# Patient Record
Sex: Female | Born: 1985
Health system: Southern US, Community
[De-identification: ages and names within clinical notes are randomized; demographics above are authoritative.]

---

## 2018-11-03 ENCOUNTER — Other Ambulatory Visit: Payer: Self-pay | Admitting: Family Medicine

## 2018-11-03 DIAGNOSIS — N644 Mastodynia: Secondary | ICD-10-CM

## 2018-11-03 DIAGNOSIS — N6320 Unspecified lump in the left breast, unspecified quadrant: Secondary | ICD-10-CM

## 2018-11-09 ENCOUNTER — Ambulatory Visit
Admission: RE | Admit: 2018-11-09 | Discharge: 2018-11-09 | Disposition: A | Discharge status: Home / Self Care | Payer: 59 | Source: Ambulatory Visit | Attending: Family Medicine | Admitting: Family Medicine

## 2018-11-09 ENCOUNTER — Other Ambulatory Visit: Payer: Self-pay

## 2018-11-09 ENCOUNTER — Other Ambulatory Visit: Payer: Self-pay | Admitting: Family Medicine

## 2018-11-09 ENCOUNTER — Ambulatory Visit: Payer: Self-pay

## 2018-11-09 DIAGNOSIS — N6320 Unspecified lump in the left breast, unspecified quadrant: Secondary | ICD-10-CM

## 2018-11-09 DIAGNOSIS — N644 Mastodynia: Secondary | ICD-10-CM

## 2019-05-15 ENCOUNTER — Other Ambulatory Visit: Payer: 59

## 2019-06-02 ENCOUNTER — Other Ambulatory Visit: Payer: 59

## 2019-06-14 ENCOUNTER — Other Ambulatory Visit (HOSPITAL_COMMUNITY)
Admission: RE | Admit: 2019-06-14 | Discharge: 2019-06-14 | Disposition: A | Discharge status: Home / Self Care | Payer: 59 | Source: Ambulatory Visit | Attending: Family Medicine | Admitting: Family Medicine

## 2019-06-14 ENCOUNTER — Other Ambulatory Visit: Payer: Self-pay | Admitting: Family Medicine

## 2019-06-14 DIAGNOSIS — Z124 Encounter for screening for malignant neoplasm of cervix: Secondary | ICD-10-CM | POA: Diagnosis present

## 2019-06-16 ENCOUNTER — Other Ambulatory Visit: Payer: Self-pay | Admitting: Family Medicine

## 2019-06-16 ENCOUNTER — Ambulatory Visit
Admission: RE | Admit: 2019-06-16 | Discharge: 2019-06-16 | Disposition: A | Discharge status: Home / Self Care | Payer: 59 | Source: Ambulatory Visit | Attending: Family Medicine | Admitting: Family Medicine

## 2019-06-16 ENCOUNTER — Other Ambulatory Visit: Payer: Self-pay

## 2019-06-16 DIAGNOSIS — N6320 Unspecified lump in the left breast, unspecified quadrant: Secondary | ICD-10-CM

## 2019-06-16 LAB — CYTOLOGY - PAP
Comment: NEGATIVE
Diagnosis: NEGATIVE
High risk HPV: NEGATIVE

## 2019-12-11 ENCOUNTER — Other Ambulatory Visit: Payer: 59

## 2019-12-26 ENCOUNTER — Ambulatory Visit
Admission: RE | Admit: 2019-12-26 | Discharge: 2019-12-26 | Disposition: A | Discharge status: Home / Self Care | Payer: 59 | Source: Ambulatory Visit | Attending: Family Medicine | Admitting: Family Medicine

## 2019-12-26 ENCOUNTER — Other Ambulatory Visit: Payer: Self-pay

## 2019-12-26 ENCOUNTER — Other Ambulatory Visit: Payer: Self-pay | Admitting: Family Medicine

## 2019-12-26 DIAGNOSIS — N6320 Unspecified lump in the left breast, unspecified quadrant: Secondary | ICD-10-CM

## 2020-06-18 DIAGNOSIS — Z0000 Encounter for general adult medical examination without abnormal findings: Secondary | ICD-10-CM | POA: Diagnosis not present

## 2020-06-18 DIAGNOSIS — Z13220 Encounter for screening for lipoid disorders: Secondary | ICD-10-CM | POA: Diagnosis not present

## 2020-06-18 DIAGNOSIS — E039 Hypothyroidism, unspecified: Secondary | ICD-10-CM | POA: Diagnosis not present

## 2020-07-24 ENCOUNTER — Encounter: Payer: Self-pay | Admitting: Registered"

## 2020-07-24 ENCOUNTER — Encounter: Payer: BC Managed Care – PPO | Attending: Family Medicine | Admitting: Registered"

## 2020-07-24 ENCOUNTER — Other Ambulatory Visit: Payer: Self-pay

## 2020-07-24 DIAGNOSIS — Z713 Dietary counseling and surveillance: Secondary | ICD-10-CM | POA: Insufficient documentation

## 2020-07-24 NOTE — Progress Notes (Signed)
Appointment start time: 11:25  Appointment end time: 12:33  Patient was seen on 07/24/2020 for nutrition counseling pertaining to disordered eating  Primary care provider: Milagros Evener, MD Therapist: N/A  ROI: N/A Any other medical team members: none   Assessment  Pt arrives stating she was 188 lbs in 07/2019 and lost 26 lbs in 9 months. States she was weighing 162 lbs in 04/2020. States since then she has been gradually increasing in weight, recently weight about 176 lbs. States she is able to lose weight again by restricting.   States she is a vegetarian.   States she was diagnosed with COVID since 04/2020, was in [place], and gained some weight back. States she has been unable to exercise and able to to eat according to diet to achieve weight loss. States her weight increases during the day when she checks at night because she ate during the day. States weight is usually 1-2 lbs more. States it takes her a whole week of dieting to return to basal weight if she eats out that week. States she has been aiming for 1000 cals/day and was told this is not enough for her; has increased to 1200 cals/day. States she eats when hungry. Will eat dates or milk when she wakes up hungry during the night. States she eats rice and gains weight. States sources of protein for her are lentils, protein powder mixed with water. States she mixes protein powder only with water because its easy. States she does not eat fruit before lunch and did not like fruits and vegetables much as child.   Reports hunger triggers migraines; takes naproxen to help manage.   States she has lower back issues. Reports her concerns of continuing to gain weight and fears it will affect knees later in life.   Works as Government social research officer. Pt is married with one son (60 years old).    Growth Metrics: Goal rate of weight gain:  0.5-1.0 lb/week  Eating history: Length of time:  Previous treatments: none Goals for RD meetings: improve  dizziness/lightheadedness, headaches, low BP, focus and concentration, cold intolerance  Weight history:  Highest weight:    Lowest weight:  Most consistent weight:   What would you like to weigh: How has weight changed in the past year: weight loss and gain  Medical Information:  Changes in hair, skin, nails since ED started: hair shed Chewing/swallowing difficulties: no Reflux or heartburn: no Trouble with teeth: no LMP without the use of hormones: 3/10  Weight at that point: 176 Effect of exercise on menses: N/A   Effect of hormones on menses: N/A Constipation, diarrhea: no, has BM daily Dizziness/lightheadedness: sometimes Headaches/body aches: every weekend due to sleeping later and waking up hungrier Heart racing/chest pain: has low BP Mood: tired, fatigue Sleep: some challenges; averages 7 hrs Focus/concentration: yes, sometimes Cold intolerance: yes Vision changes: no  Mental health diagnosis:    Dietary assessment: A typical day consists of 1-2 meals and 2 snacks  Safe foods include:  Avoided foods include: gluten (sometimes), meat  24 hour recall:  B: coffee (with unsweetened coconut milk + full fat milk) + mac and cheese or protein drink with water S: L: ~1 lb watermelon  S: coffee (with milk) + 1/2 c rice + 2 eggs + 1 c chickpea snacks  D (6:30 pm): quinoa + lentil curry + 2 almond tortillas  S:   Beverages: coffee, herbal tea, water (3L; 64*3 oz)  What Methods Do You Use To Control Your  Weight (Compensatory behaviors)?           Restricting (calories, fat, carbs)  Estimated energy intake: 1400-1500 kcal  Estimated energy needs: 1800-2000 kcal 200-225 g CHO 135-150 g pro 50-56 g fat  Nutrition Diagnosis: NB-1.5 Disordered eating pattern As related to skipping meals.  As evidenced by dietary recall.  Intervention/Goals: Pt was educated and counseled on eating to nourish the body, signs/symptoms of not being adequately nourished, and ways to increase  nourishment. Discussed side effects of hypothyroidism including weight gain. Discussed diet culture and BMI related to overall health. Pt agreed with goals listed. Goals: - Aim to have 3 meals/day. Meals should include 1/2 plate starch/grain + 1/4 plate protein + 1/4 plate fruit/vegetable + calcium/dairy + fat.  - Add oatmeal + banana to protein drink for breakfast.    Meal plan:    3 meals    1-2 snacks   Monitoring and Evaluation: Patient will follow up in 4 weeks.

## 2020-07-24 NOTE — Patient Instructions (Signed)
-   Aim to have 3 meals/day. Meals should include 1/2 plate starch/grain + 1/4 plate protein + 1/4 plate fruit/vegetable + calcium/dairy + fat.   - Add oatmeal + banana to protein drink for breakfast.

## 2020-08-13 ENCOUNTER — Encounter: Payer: Self-pay | Admitting: Registered"

## 2020-08-13 ENCOUNTER — Other Ambulatory Visit: Payer: Self-pay

## 2020-08-13 ENCOUNTER — Encounter: Payer: BC Managed Care – PPO | Attending: Family Medicine | Admitting: Registered"

## 2020-08-13 DIAGNOSIS — Z713 Dietary counseling and surveillance: Secondary | ICD-10-CM | POA: Diagnosis not present

## 2020-08-13 NOTE — Patient Instructions (Addendum)
-   Aim to have afternoon snack of carbohydrate + protein.   - Try sleepytime tea at night to help wind down.   - Continue to having balanced meals! Great job with this!

## 2020-08-13 NOTE — Progress Notes (Signed)
Appointment start time: 2:07  Appointment end time: 3:00  Patient was seen on 08/13/2020 for nutrition counseling pertaining to disordered eating  Primary care provider: Beverley Fiedler, MD Therapist: N/A  ROI: N/A Any other medical team members: none   Assessment  Pt arrives stating she has increased nourishment since previous visit. States signs and symptoms have improved. States migraines have decreased, only 1 migraine during the night. States she has only taken migraine medicine once since previous visit 2.5 weeks ago. States she used to take once a week.   Previous visit: Pt arrives stating she was 188 lbs in 07/2019 and lost 26 lbs in 9 months. States she was weighing 162 lbs in 04/2020. States since then she has been gradually increasing in weight, recently weight about 176 lbs. States she is a vegetarian. States sources of protein for her are lentils, protein powder mixed with water. States she has lower back issues. Reports her concerns of continuing to gain weight and fears it will affect knees later in life. 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    Growth Metrics: Goal rate of weight gain:  0.5-1.0 lb/week  Eating history: Length of time:  Previous treatments: none Goals for RD meetings: improve dizziness/lightheadedness, headaches, low BP, focus and concentration, cold intolerance  Weight history:  Highest weight:    Lowest weight:  Most consistent weight:   What would you like to weigh: How has weight changed in the past year: weight loss and gain  Medical Information:  Changes in hair, skin, nails since ED started: hair shed Chewing/swallowing difficulties: no Reflux or heartburn: no Trouble with teeth: no LMP without the use of hormones: 3/10  Weight at that point: 176 Effect of exercise on menses: N/A   Effect of hormones on menses: N/A Constipation, diarrhea: no, has BM daily Dizziness/lightheadedness: has improved Headaches/body  aches: every weekend due to sleeping later and waking up hungrier Heart racing/chest pain: has low BP Mood: has improved, no longer feeling tired/fatigued Sleep: some challenges; averages 7 hrs Focus/concentration: yes, sometimes Cold intolerance: yes Vision changes: no  Mental health diagnosis:    Dietary assessment: A typical day consists of 3 meals and 1 snacks  Safe foods include:  Avoided foods include: gluten (sometimes), meat  24 hour recall:  B (8 am): oatmeal + protein powder + banana S (9:30 am): L: rice + vegetables + gravy + flatbread/roti + vegetable curry S: coffee (with milk) + popcorn or fruit  D (6:30 pm): rice + vegetables + gravy + flatbread + vegetable curry + fruit   S:   Beverages: coffee, herbal tea, water (3L; 64*3 oz)  Physical activity: 30 min walking, 4x/week  What Methods Do You Use To Control Your Weight (Compensatory behaviors)?           Restricting (calories, fat, carbs)  Estimated energy intake: 1600-1700 kcal  Estimated energy needs: 1800-2000 kcal 200-225 g CHO 135-150 g pro 50-56 g fat  Nutrition Diagnosis: NB-1.5 Disordered eating pattern As related to skipping meals.  As evidenced by dietary recall.  Intervention/Goals: Pt was encouraged with changes made from previous visit. Discussed improvement in signs/symptoms with increased nourishment. Dicussed movement to help with mobility and preventing knee challenges later in life. Pt agreed with goals listed. Goals: - Aim to have afternoon snack of carbohydrate + protein.  - Try sleepytime tea at night to help wind down.  - Continue to having balanced meals! Great job with this!  Meal plan:    3 meals    1-2 snacks   Monitoring and Evaluation: Patient will follow up in 3 weeks.

## 2020-08-14 ENCOUNTER — Ambulatory Visit: Payer: BC Managed Care – PPO | Admitting: Registered"

## 2020-09-04 ENCOUNTER — Encounter: Payer: BC Managed Care – PPO | Admitting: Registered"

## 2020-09-04 ENCOUNTER — Other Ambulatory Visit: Payer: Self-pay

## 2020-09-04 ENCOUNTER — Encounter: Payer: Self-pay | Admitting: Registered"

## 2020-09-04 DIAGNOSIS — Z713 Dietary counseling and surveillance: Secondary | ICD-10-CM

## 2020-09-04 NOTE — Patient Instructions (Signed)
-   Have evening snack of fruit + protein such as trail mix or fruit + protein shake (using 1/2 scoop of protein powder) or fruit + nuts.   - Keep up the great work!

## 2020-09-04 NOTE — Progress Notes (Signed)
Appointment start time: 4:01  Appointment end time: 4:56  Patient was seen on 09/04/2020 for nutrition counseling pertaining to disordered eating  Primary care provider: Milagros Evener, MD Therapist: N/A  ROI: N/A Any other medical team members: none   Assessment  Pt states she was on vacation last week at the beach with family. 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 Reports she has been more tired lately and having challenges with pollen. Reports drinking more coffee due to tiredness. States she has gained weight gradually over time; gained about 3 lbs in the last month.   States when waking up at night, will drink milk. States she has a migraine 2 times since previous visit. States she had 2 slices of bread at night once.   States she is learning how to swim.   Previous visit:  States signs and symptoms have improved. States migraines have decreased. Pt arrives stating she was 188 lbs in 07/2019 and lost 26 lbs in 9 months. States she was weighing 162 lbs in 04/2020. States since then she has been gradually increasing in weight, recently weight about 176 lbs. States she is a vegetarian. States sources of protein for her are lentils, protein powder mixed with water. States she has lower back issues. Reports her concerns of continuing to gain weight and fears it will affect knees later in life. 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    Growth Metrics: Goal rate of weight gain:  0.5-1.0 lb/week  Eating history: Length of time:  Previous treatments: none Goals for RD meetings: improve dizziness/lightheadedness, headaches, low BP, focus and concentration, cold intolerance  Weight history:  Highest weight:    Lowest weight:  Most consistent weight:   What would you like to weigh: How has weight changed in the past year: weight loss and gain  Medical Information:  Changes in hair, skin, nails since ED started: hair  shed Chewing/swallowing difficulties: no Reflux or heartburn: no Trouble with teeth: no LMP without the use of hormones: 4/27  Weight at that point: 176 Effect of exercise on menses: N/A   Effect of hormones on menses: N/A Constipation, diarrhea: no, has BM daily Dizziness/lightheadedness: no Headaches/body aches: 2x since previous visit  Heart racing/chest pain: has low BP Mood: has improved, no longer feeling tired/fatigued Sleep: sleep has improved, some challenges; averages 7 hrs Focus/concentration: has improved Cold intolerance: has improved Vision changes: no  Mental health diagnosis:    Dietary assessment: A typical day consists of 3 meals and 1 snacks  Safe foods include:  Avoided foods include: gluten (sometimes), meat, salad  24 hour recall:  B (8 am): oatmeal + protein powder + banana or mac and cheese + banana S (9:30 am): whole milk or 3 coffee  L: dal/lentil + roti + gravy or rice + vegetables + gravy + flatbread/roti + vegetable curry S (4 pm): protein powder + coffee (with milk) + wafers  D (6:30 pm): rice + vegetables + chips + yogurt or vegetable pizza  S: ice cream or almond crackers  Beverages: coffee, herbal tea, water (3L; 64*3 oz), protein shakes  Physical activity: 30 min walking, 4x/week, swim lessons once/week  What Methods Do You Use To Control Your Weight (Compensatory behaviors)?           Restricting (calories, fat, carbs)  Estimated energy intake: 1600-1700 kcal  Estimated energy needs: 1800-2000 kcal 200-225 g CHO 135-150 g  pro 50-56 g fat  Nutrition Diagnosis: NB-1.5 Disordered eating pattern As related to skipping meals.  As evidenced by dietary recall.  Intervention/Goals: Pt was encouraged with changes made from previous visit. Discussed improvement in signs/symptoms with increased nourishment. Discussed balanced snack options for evening snack. Pt agreed with goals listed. Goals: - Have evening snack of fruit + protein such as  trail mix or fruit + protein shake (using 1/2 scoop of protein powder) or fruit + nuts.  - Keep up the great work!   Meal plan:    3 meals    1-2 snacks   Monitoring and Evaluation: Patient will follow up in 6 weeks.

## 2020-10-11 DIAGNOSIS — W260XXA Contact with knife, initial encounter: Secondary | ICD-10-CM | POA: Diagnosis not present

## 2020-10-11 DIAGNOSIS — S61012A Laceration without foreign body of left thumb without damage to nail, initial encounter: Secondary | ICD-10-CM | POA: Diagnosis not present

## 2020-10-15 ENCOUNTER — Encounter: Payer: BC Managed Care – PPO | Attending: Family Medicine | Admitting: Registered"

## 2020-10-15 ENCOUNTER — Encounter: Payer: Self-pay | Admitting: Registered"

## 2020-10-15 ENCOUNTER — Other Ambulatory Visit: Payer: Self-pay

## 2020-10-15 DIAGNOSIS — Z713 Dietary counseling and surveillance: Secondary | ICD-10-CM | POA: Diagnosis not present

## 2020-10-15 NOTE — Progress Notes (Signed)
Appointment start time: 4:08  Appointment end time: 4:49  Patient was seen on 10/15/2020 for nutrition counseling pertaining to disordered eating  Primary care provider: Beverley Fiedler, MD Therapist: N/A  ROI: N/A Any other medical team members: none   Assessment  Pt states she has been trying to eat balanced meals. States she cut her thumb on non-dominant hand and has been limited with preparing food. Reports she will have stitches removed this weekend. States sleeping has improved and has decreased fatigue.   Reports still having some hair loss. Migraines have improved. States she has only taken migraine medicine once since previous visit, 6 weeks ago. States she has been trying to walk 30 min. States she has been taking fruit to work to have with lunch or afternoon snack.   States her recent BP was 95/59, pulse 68.   Previous visit:  States signs and symptoms have improved. States migraines have decreased. Pt arrives stating she was 188 lbs in 07/2019 and lost 26 lbs in 9 months. States she was weighing 162 lbs in 04/2020. States since then she has been gradually increasing in weight, recently weight about 176 lbs. States she is a vegetarian. States sources of protein for her are lentils, protein powder mixed with water. States she has lower back issues. Reports her concerns of continuing to gain weight and fears it will affect knees later in life. 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    Growth Metrics: Goal rate of weight gain:  0.5-1.0 lb/week  Eating history: Length of time:  Previous treatments: none Goals for RD meetings: improve dizziness/lightheadedness, headaches, low BP, focus and concentration, cold intolerance  Weight history:  Highest weight:    Lowest weight:  Most consistent weight:   What would you like to weigh: How has weight changed in the past year: weight loss and gain  Medical Information:  Changes in hair, skin, nails since ED  started: hair shedding Chewing/swallowing difficulties: no Reflux or heartburn: no Trouble with teeth: no LMP without the use of hormones: 4/27  Weight at that point: 176 Effect of exercise on menses: N/A   Effect of hormones on menses: N/A Constipation, diarrhea: no, has BM daily Dizziness/lightheadedness: no Headaches/body aches: 1x since previous visit  Heart racing/chest pain: has low BP Mood: has improved, no longer feeling tired/fatigued Sleep: sleep has improved, some challenges; averages 7 hrs Focus/concentration: has improved Cold intolerance: has improved Vision changes: no  Mental health diagnosis:    Dietary assessment: A typical day consists of 3 meals and 1 snacks  Safe foods include:  Avoided foods include: gluten (sometimes), meat, salad  24 hour recall:  B (8 am): 2 slices of bread + protein powder  S (9:30 am):   L: 3 (rice, lentils) + spice powder + oil or fruit + 4 roti  S (4 pm):   D (6:30 pm): rice + yogurt + pickle   S:  Beverages: coffee, herbal tea, water (96 oz), protein shakes  Physical activity: 30 min walking, 4x/week and swim lessons once/week, sometimes  What Methods Do You Use To Control Your Weight (Compensatory behaviors)?           Restricting (calories, fat, carbs)  Estimated energy intake: 1600-1700 kcal  Estimated energy needs: 1800-2000 kcal 200-225 g CHO 135-150 g pro 50-56 g fat  Nutrition Diagnosis: NB-1.5 Disordered eating pattern As related to skipping meals.  As evidenced by dietary recall.  Intervention/Goals: Pt was encouraged with  changes made from previous visit. Discussed improvement in signs/symptoms with increased nourishment. Discussed balanced snack options for evening snack. Pt agreed with goals listed. Goals: - Have evening snack of fruit + protein such as trail mix or fruit + protein shake (using 1/2 scoop of protein powder) or fruit + nuts.    Meal plan:    3 meals    1-2 snacks   Monitoring and  Evaluation: Did not schedule follow-up. Will contact patient on 6/28 for potential follow-up appt.

## 2020-11-18 DIAGNOSIS — M9905 Segmental and somatic dysfunction of pelvic region: Secondary | ICD-10-CM | POA: Diagnosis not present

## 2020-11-18 DIAGNOSIS — M9903 Segmental and somatic dysfunction of lumbar region: Secondary | ICD-10-CM | POA: Diagnosis not present

## 2020-11-18 DIAGNOSIS — M62830 Muscle spasm of back: Secondary | ICD-10-CM | POA: Diagnosis not present

## 2020-11-18 DIAGNOSIS — M5417 Radiculopathy, lumbosacral region: Secondary | ICD-10-CM | POA: Diagnosis not present

## 2020-11-20 DIAGNOSIS — M5417 Radiculopathy, lumbosacral region: Secondary | ICD-10-CM | POA: Diagnosis not present

## 2020-11-20 DIAGNOSIS — M9903 Segmental and somatic dysfunction of lumbar region: Secondary | ICD-10-CM | POA: Diagnosis not present

## 2020-11-20 DIAGNOSIS — M9905 Segmental and somatic dysfunction of pelvic region: Secondary | ICD-10-CM | POA: Diagnosis not present

## 2020-11-20 DIAGNOSIS — M62830 Muscle spasm of back: Secondary | ICD-10-CM | POA: Diagnosis not present

## 2020-11-25 DIAGNOSIS — M9903 Segmental and somatic dysfunction of lumbar region: Secondary | ICD-10-CM | POA: Diagnosis not present

## 2020-11-25 DIAGNOSIS — M5417 Radiculopathy, lumbosacral region: Secondary | ICD-10-CM | POA: Diagnosis not present

## 2020-11-25 DIAGNOSIS — M62830 Muscle spasm of back: Secondary | ICD-10-CM | POA: Diagnosis not present

## 2020-11-25 DIAGNOSIS — M9905 Segmental and somatic dysfunction of pelvic region: Secondary | ICD-10-CM | POA: Diagnosis not present

## 2020-11-28 ENCOUNTER — Other Ambulatory Visit: Payer: Self-pay | Admitting: Family Medicine

## 2020-11-28 DIAGNOSIS — Z09 Encounter for follow-up examination after completed treatment for conditions other than malignant neoplasm: Secondary | ICD-10-CM

## 2020-12-18 DIAGNOSIS — M9905 Segmental and somatic dysfunction of pelvic region: Secondary | ICD-10-CM | POA: Diagnosis not present

## 2020-12-18 DIAGNOSIS — M5417 Radiculopathy, lumbosacral region: Secondary | ICD-10-CM | POA: Diagnosis not present

## 2020-12-18 DIAGNOSIS — M62830 Muscle spasm of back: Secondary | ICD-10-CM | POA: Diagnosis not present

## 2020-12-18 DIAGNOSIS — M9903 Segmental and somatic dysfunction of lumbar region: Secondary | ICD-10-CM | POA: Diagnosis not present

## 2021-01-01 DIAGNOSIS — M5417 Radiculopathy, lumbosacral region: Secondary | ICD-10-CM | POA: Diagnosis not present

## 2021-01-01 DIAGNOSIS — M9903 Segmental and somatic dysfunction of lumbar region: Secondary | ICD-10-CM | POA: Diagnosis not present

## 2021-01-01 DIAGNOSIS — M62830 Muscle spasm of back: Secondary | ICD-10-CM | POA: Diagnosis not present

## 2021-01-01 DIAGNOSIS — M9905 Segmental and somatic dysfunction of pelvic region: Secondary | ICD-10-CM | POA: Diagnosis not present

## 2021-01-03 ENCOUNTER — Ambulatory Visit
Admission: RE | Admit: 2021-01-03 | Discharge: 2021-01-03 | Disposition: A | Discharge status: Home / Self Care | Payer: BC Managed Care – PPO | Source: Ambulatory Visit | Attending: Family Medicine | Admitting: Family Medicine

## 2021-01-03 ENCOUNTER — Other Ambulatory Visit: Payer: Self-pay

## 2021-01-03 DIAGNOSIS — Z09 Encounter for follow-up examination after completed treatment for conditions other than malignant neoplasm: Secondary | ICD-10-CM

## 2021-01-03 DIAGNOSIS — R922 Inconclusive mammogram: Secondary | ICD-10-CM | POA: Diagnosis not present

## 2021-02-19 DIAGNOSIS — M4316 Spondylolisthesis, lumbar region: Secondary | ICD-10-CM | POA: Diagnosis not present

## 2021-02-19 DIAGNOSIS — M5386 Other specified dorsopathies, lumbar region: Secondary | ICD-10-CM | POA: Diagnosis not present

## 2021-02-19 DIAGNOSIS — M9903 Segmental and somatic dysfunction of lumbar region: Secondary | ICD-10-CM | POA: Diagnosis not present

## 2021-02-19 DIAGNOSIS — M7612 Psoas tendinitis, left hip: Secondary | ICD-10-CM | POA: Diagnosis not present

## 2021-06-24 DIAGNOSIS — Z23 Encounter for immunization: Secondary | ICD-10-CM | POA: Diagnosis not present

## 2021-06-24 DIAGNOSIS — E039 Hypothyroidism, unspecified: Secondary | ICD-10-CM | POA: Diagnosis not present

## 2021-06-24 DIAGNOSIS — Z13220 Encounter for screening for lipoid disorders: Secondary | ICD-10-CM | POA: Diagnosis not present

## 2021-06-24 DIAGNOSIS — Z0000 Encounter for general adult medical examination without abnormal findings: Secondary | ICD-10-CM | POA: Diagnosis not present

## 2021-11-05 DIAGNOSIS — R051 Acute cough: Secondary | ICD-10-CM | POA: Diagnosis not present

## 2022-03-17 DIAGNOSIS — N9489 Other specified conditions associated with female genital organs and menstrual cycle: Secondary | ICD-10-CM | POA: Diagnosis not present

## 2022-04-08 IMAGING — MG DIGITAL DIAGNOSTIC BILAT W/ TOMO W/ CAD
8 series · 8 of 24 positions shown · non-contrast
Comparison: Previous exam(s).

CLINICAL DATA: 35-year-old female for 2 year follow-up of LEFT
breast masses and for annual bilateral mammogram.

EXAM:
DIGITAL DIAGNOSTIC BILATERAL MAMMOGRAM WITH TOMOSYNTHESIS AND CAD;
ULTRASOUND LEFT BREAST LIMITED
TECHNIQUE: Bilateral digital diagnostic mammography and breast tomosynthesis
was performed. The images were evaluated with computer-aided
detection.; Targeted ultrasound examination of the left breast was
performed.

[L MLO synth-2D]
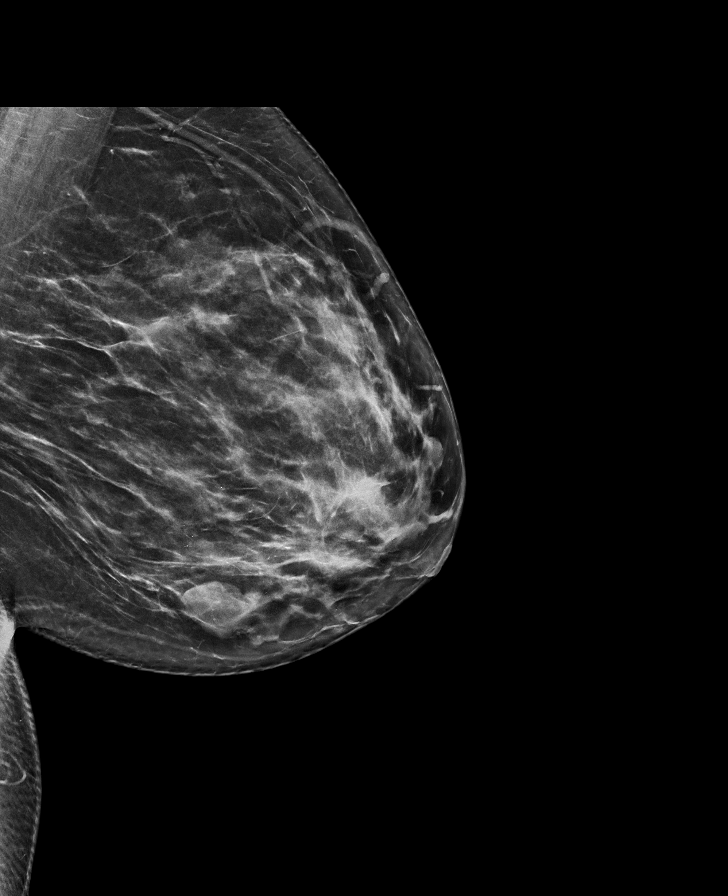

[L CC synth-2D]
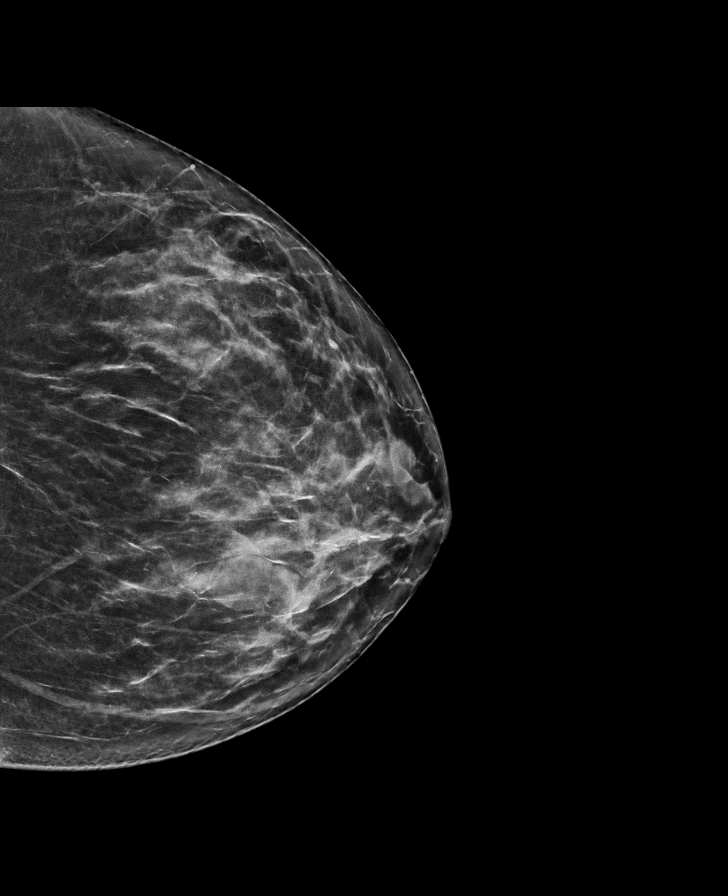

[R CC synth-2D]
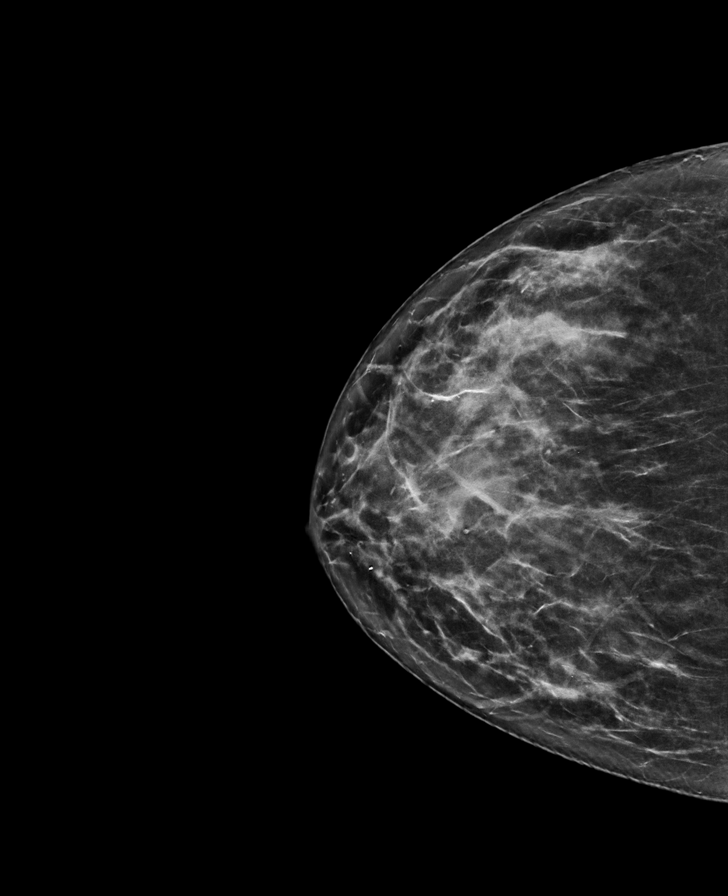

[R MLO synth-2D]
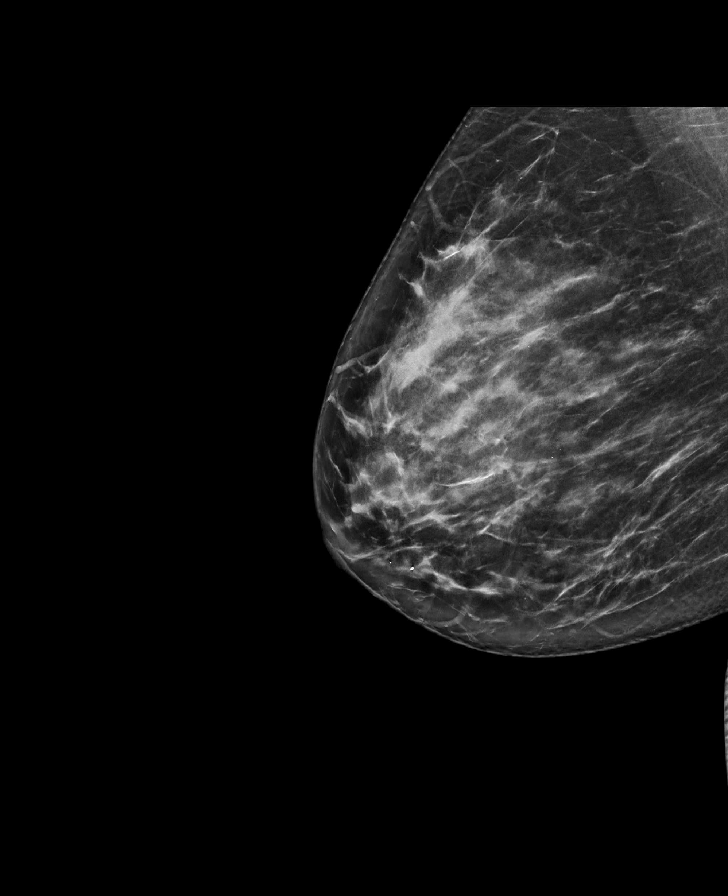

[R MLO tomo · tomo slice 39/76.0]
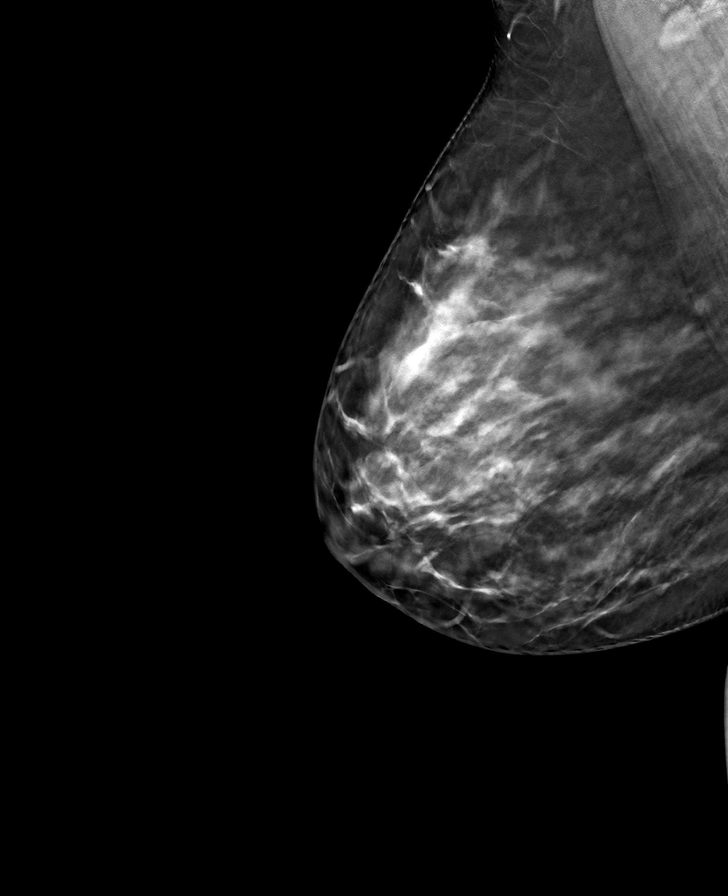

[R CC tomo · tomo slice 35/69.0]
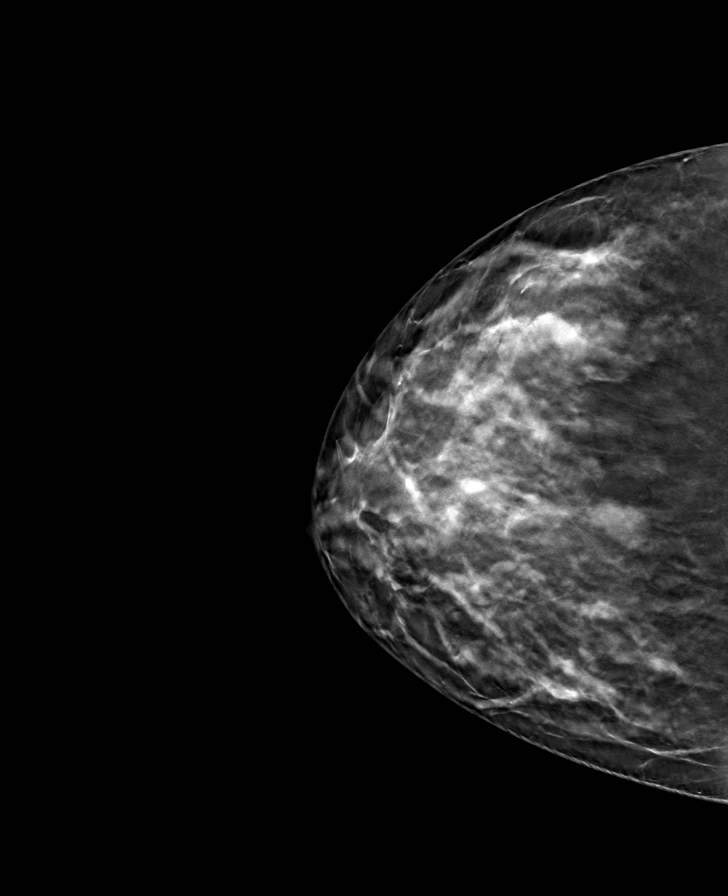

[L MLO tomo · tomo slice 39/77.0]
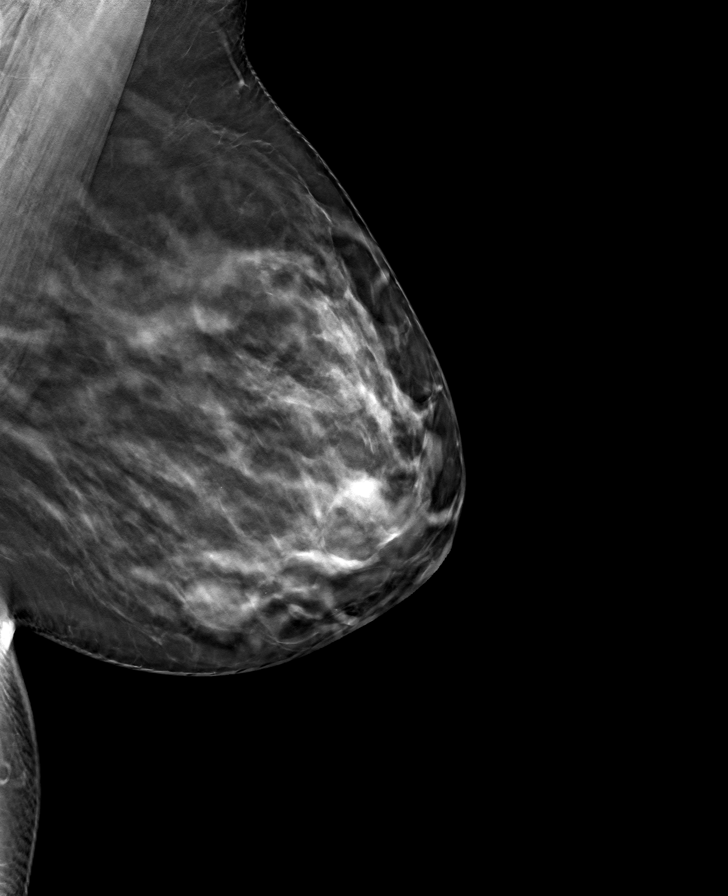

[L CC tomo · tomo slice 36/71.0]
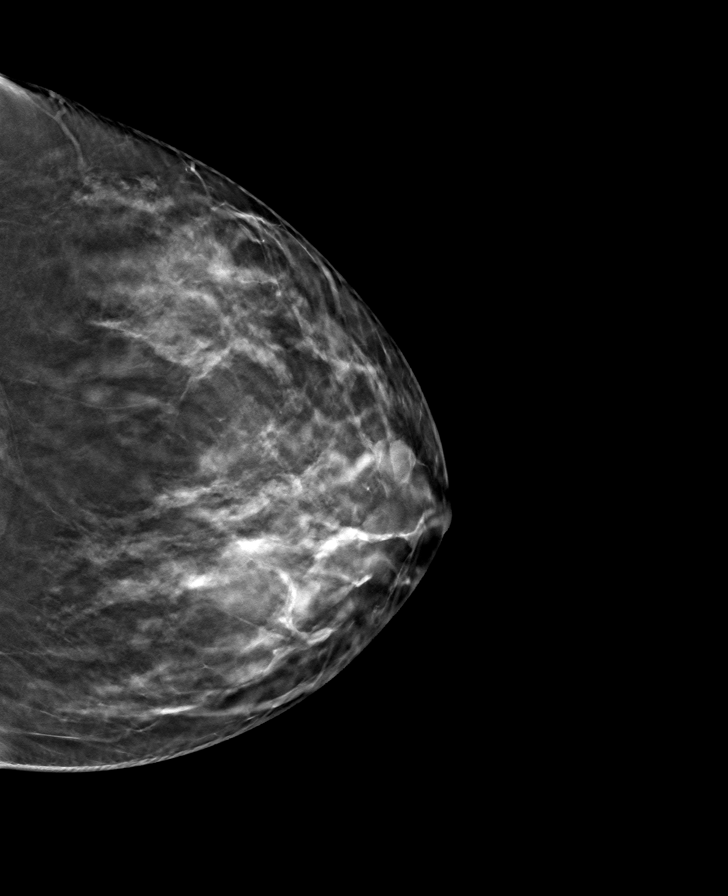

[8 of 24 positions shown; findings below may reference images not displayed]

ACR Breast Density Category c: The breast tissue is heterogeneously
dense, which may obscure small masses.
FINDINGS: 2D/3D full field views of both breasts demonstrate unchanged
circumscribed masses within the LOWER INNER LEFT breast and
UPPER-OUTER LEFT breast.

No new or suspicious findings within either breast noted.

Targeted ultrasound is performed, showing 2 stable circumscribed
oval hypoechoic masses within the LEFT breast as follows:

A 2.4 x 1.2 x 2.2 cm mass at the 8 o'clock position 4 cm from the
nipple.

A 1.2 x 0.7 x 1.3 cm mass at the 1 o'clock position 2 cm from the
nipple.
IMPRESSION: 1. Two stable masses within the LEFT breast as described, compatible
with benign fibroadenomas. No further imaging follow-up recommended.
2. No new or suspicious mammographic findings within either breast.

RECOMMENDATION:
Bilateral screening mammogram at age 40.

I have discussed the findings and recommendations with the patient.
If applicable, a reminder letter will be sent to the patient
regarding the next appointment.

BI-RADS CATEGORY  2: Benign.

## 2022-06-29 DIAGNOSIS — Z0000 Encounter for general adult medical examination without abnormal findings: Secondary | ICD-10-CM | POA: Diagnosis not present

## 2022-06-29 DIAGNOSIS — M1990 Unspecified osteoarthritis, unspecified site: Secondary | ICD-10-CM | POA: Diagnosis not present

## 2022-06-29 DIAGNOSIS — G43909 Migraine, unspecified, not intractable, without status migrainosus: Secondary | ICD-10-CM | POA: Diagnosis not present

## 2022-06-29 DIAGNOSIS — E039 Hypothyroidism, unspecified: Secondary | ICD-10-CM | POA: Diagnosis not present

## 2022-06-29 DIAGNOSIS — Z1159 Encounter for screening for other viral diseases: Secondary | ICD-10-CM | POA: Diagnosis not present

## 2022-06-29 DIAGNOSIS — Z130 Encounter for screening for diseases of the blood and blood-forming organs and certain disorders involving the immune mechanism: Secondary | ICD-10-CM | POA: Diagnosis not present

## 2022-10-06 DIAGNOSIS — Z01419 Encounter for gynecological examination (general) (routine) without abnormal findings: Secondary | ICD-10-CM | POA: Diagnosis not present

## 2023-02-05 DIAGNOSIS — M25562 Pain in left knee: Secondary | ICD-10-CM | POA: Diagnosis not present

## 2023-02-05 DIAGNOSIS — M7651 Patellar tendinitis, right knee: Secondary | ICD-10-CM | POA: Diagnosis not present

## 2023-02-18 DIAGNOSIS — M7652 Patellar tendinitis, left knee: Secondary | ICD-10-CM | POA: Diagnosis not present

## 2023-02-18 DIAGNOSIS — M25562 Pain in left knee: Secondary | ICD-10-CM | POA: Diagnosis not present

## 2023-02-28 DIAGNOSIS — S335XXA Sprain of ligaments of lumbar spine, initial encounter: Secondary | ICD-10-CM | POA: Diagnosis not present
